# Patient Record
Sex: Female | Born: 1980 | Hispanic: No | Marital: Single | Smoking: Never smoker
Health system: Southern US, Community
[De-identification: ages and names within clinical notes are randomized; demographics above are authoritative.]

---

## 2021-06-13 ENCOUNTER — Encounter (HOSPITAL_COMMUNITY): Payer: Self-pay | Admitting: Emergency Medicine

## 2021-06-13 ENCOUNTER — Emergency Department (HOSPITAL_COMMUNITY)
Admission: EM | Admit: 2021-06-13 | Discharge: 2021-06-13 | Disposition: A | Discharge status: Home / Self Care | Payer: 59 | Attending: Emergency Medicine | Admitting: Emergency Medicine

## 2021-06-13 ENCOUNTER — Other Ambulatory Visit: Payer: Self-pay

## 2021-06-13 DIAGNOSIS — Y92410 Unspecified street and highway as the place of occurrence of the external cause: Secondary | ICD-10-CM | POA: Diagnosis not present

## 2021-06-13 DIAGNOSIS — M542 Cervicalgia: Secondary | ICD-10-CM | POA: Insufficient documentation

## 2021-06-13 DIAGNOSIS — R109 Unspecified abdominal pain: Secondary | ICD-10-CM | POA: Diagnosis not present

## 2021-06-13 DIAGNOSIS — M545 Low back pain, unspecified: Secondary | ICD-10-CM | POA: Insufficient documentation

## 2021-06-13 MED ORDER — METHOCARBAMOL 500 MG PO TABS: 500.0000 mg | ORAL_TABLET | Freq: Two times a day (BID) | ORAL | 0 refills | Status: AC

## 2021-06-13 NOTE — Discharge Instructions (Addendum)
The pain you are experiencing is likely due to muscle strain, you may take Ibuprofen or Naprosyn and Robaxin as needed for pain management. Do not combine with any pain reliever other than tylenol.  You may also use ice and heat, and over-the-counter remedies such as Biofreeze gel or salon pas lidocaine patches. The muscle soreness should improve over the next week. Follow up with your family doctor in the next week for a recheck if you are still having symptoms. Return to ED if pain is worsening, you develop weakness or numbness of extremities, or new or concerning symptoms develop. ° °
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
## 2021-06-20 ENCOUNTER — Encounter (HOSPITAL_COMMUNITY): Payer: Self-pay | Admitting: *Deleted

## 2021-06-20 ENCOUNTER — Emergency Department (HOSPITAL_COMMUNITY)
Admission: EM | Admit: 2021-06-20 | Discharge: 2021-06-20 | Disposition: A | Discharge status: Home / Self Care | Payer: 59 | Attending: Emergency Medicine | Admitting: Emergency Medicine

## 2021-06-20 ENCOUNTER — Other Ambulatory Visit: Payer: Self-pay

## 2021-06-20 ENCOUNTER — Emergency Department (HOSPITAL_COMMUNITY): Payer: 59

## 2021-06-20 DIAGNOSIS — M545 Low back pain, unspecified: Secondary | ICD-10-CM | POA: Insufficient documentation

## 2021-06-20 DIAGNOSIS — Y92410 Unspecified street and highway as the place of occurrence of the external cause: Secondary | ICD-10-CM | POA: Insufficient documentation

## 2021-06-20 DIAGNOSIS — M25551 Pain in right hip: Secondary | ICD-10-CM | POA: Insufficient documentation

## 2021-06-20 MED ORDER — PREDNISONE 10 MG (21) PO TBPK: ORAL_TABLET | Freq: Every day | ORAL | 0 refills | Status: AC

## 2021-06-20 MED ORDER — KETOROLAC TROMETHAMINE 15 MG/ML IJ SOLN
15.0000 mg | Freq: Once | INTRAMUSCULAR | Status: AC
  Administered 2021-06-20: 15 mg via INTRAMUSCULAR
  Filled 2021-06-20: qty 1

## 2021-06-20 MED ORDER — OXYCODONE-ACETAMINOPHEN 5-325 MG PO TABS
2.0000 | ORAL_TABLET | Freq: Once | ORAL | Status: AC
Start: 2021-06-20 — End: 2021-06-20
  Administered 2021-06-20: 2 via ORAL
  Filled 2021-06-20: qty 2

## 2021-06-20 NOTE — Discharge Instructions (Addendum)
As we discussed, if your pain does not improve I recommend you seek orthopedic evaluation at your earliest convenience.
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
## 2021-06-20 NOTE — ED Notes (Signed)
Called to be placed in a room, no answer.
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
## 2022-02-14 ENCOUNTER — Ambulatory Visit (INDEPENDENT_AMBULATORY_CARE_PROVIDER_SITE_OTHER): Payer: Self-pay

## 2022-02-14 ENCOUNTER — Encounter (HOSPITAL_COMMUNITY): Payer: Self-pay

## 2022-02-14 ENCOUNTER — Ambulatory Visit (HOSPITAL_COMMUNITY)
Admission: EM | Admit: 2022-02-14 | Discharge: 2022-02-14 | Disposition: A | Discharge status: Home / Self Care | Payer: Self-pay | Attending: Emergency Medicine | Admitting: Emergency Medicine

## 2022-02-14 DIAGNOSIS — S93602A Unspecified sprain of left foot, initial encounter: Secondary | ICD-10-CM

## 2022-02-14 DIAGNOSIS — M79672 Pain in left foot: Secondary | ICD-10-CM

## 2022-02-14 MED ORDER — IBUPROFEN 800 MG PO TABS: 800.0000 mg | ORAL_TABLET | Freq: Three times a day (TID) | ORAL | 0 refills | Status: AC

## 2022-02-14 NOTE — ED Triage Notes (Signed)
2 day h/o dorsal surface left foot pain that has increased since the onset. ?Notes some swelling. ?Pain with ambulation. No falls or injuries ?

## 2022-02-14 NOTE — ED Provider Notes (Signed)
?[facility] ? ? ?MRN: [id number] DOB: 11/04/1980 ? ?Subjective:  ? ?Chief Complaint;  ?Chief Complaint  ?Patient presents with  ? Foot Pain  ?  L  ? ? ?Natasha Gregory is a 41 y.o. female presenting for left top of her foot pain for the last few days increasingly worse.  She denies any specific trauma.  She does actively walk her dog and climbs stairs regularly.  She denies taking any medication for this pain ? ?No current facility-administered medications for this encounter. ? ?Current Outpatient Medications:  ?  ibuprofen (ADVIL) 800 MG tablet, Take 1 tablet (800 mg total) by mouth 3 (three) times daily., Disp: 21 tablet, Rfl: 0 ?  methocarbamol (ROBAXIN) 500 MG tablet, Take 1 tablet (500 mg total) by mouth 2 (two) times daily., Disp: 20 tablet, Rfl: 0 ?  predniSONE (STERAPRED UNI-PAK 21 TAB) 10 MG (21) TBPK tablet, Take by mouth daily. Take 6 tabs by mouth daily  for 2 days, then 5 tabs for 2 days, then 4 tabs for 2 days, then 3 tabs for 2 days, 2 tabs for 2 days, then 1 tab by mouth daily for 2 days, Disp: 42 tablet, Rfl: 0  ? ?Allergies  ?Allergen Reactions  ? Morphine And Related   ? Penicillins   ?  amoxicillin ?  ? ? ?History reviewed. No pertinent past medical history.  ? ?Review of Systems  ?All other systems reviewed and are negative. ? ? ?Objective:  ? ?Vitals: ?BP (!) 143/75 (BP Location: Right Arm)   Pulse 85   Temp 98.7 ?F (37.1 ?C) (Oral)   Resp 18   SpO2 97%  ? ?Physical Exam ?Vitals and nursing note reviewed.  ?Constitutional:   ?   General: She is not in acute distress. ?   Appearance: She is well-developed.  ?HENT:  ?   Head: Normocephalic and atraumatic.  ?Eyes:  ?   Conjunctiva/sclera: Conjunctivae normal.  ?Cardiovascular:  ?   Rate and Rhythm: Normal rate and regular rhythm.  ?   Heart sounds: No murmur heard. ?Pulmonary:  ?   Effort: Pulmonary effort is normal. No respiratory distress.  ?   Breath sounds: Normal breath sounds.  ?Abdominal:  ?   Palpations: Abdomen  is soft.  ?   Tenderness: There is no abdominal tenderness.  ?Musculoskeletal:     ?   General: Swelling present.  ?   Cervical back: Neck supple.  ?   Comments: Dorsal aspect of L foot just distal to ankle positive tenderness to palpation and mild swelling.  No erythema or evidence of infection, no deformity no NVD D full range of motion  ?Skin: ?   General: Skin is warm and dry.  ?   Capillary Refill: Capillary refill takes less than 2 seconds.  ?Neurological:  ?   Mental Status: She is alert.  ?Psychiatric:     ?   Mood and Affect: Mood normal.  ? ? ?No results found for this or any previous visit (from the past 24 hour(s)). ? ?DG Foot Complete Left ? ?Result Date: 02/14/2022 ?CLINICAL DATA:  Swelling on dorsal aspect of foot. EXAM: LEFT FOOT - COMPLETE 3+ VIEW COMPARISON:  None. FINDINGS: There is no evidence of fracture or dislocation. Enthesopathic changes are noted at the insertion site of the Achilles tendon. There is no evidence of arthropathy or other focal bone abnormality. Soft tissues are unremarkable. IMPRESSION: No acute fracture or dislocation. Electronically Signed   By: Vernona Rieger  Ladona Ridgel M.D.   On: 02/14/2022 20:05    ?  ? ?Assessment and Plan :  ? ?1. Sprain of left foot, initial encounter   ? ? ?Meds ordered this encounter  ?Medications  ? ibuprofen (ADVIL) 800 MG tablet  ?  Sig: Take 1 tablet (800 mg total) by mouth 3 (three) times daily.  ?  Dispense:  21 tablet  ?  Refill:  0  ? ? ?MDM:  ?Natasha Gregory is a 41 y.o. female presenting for pain and swelling to the left dorsal aspect forefoot unknown etiology.  X-ray shows no evidence of fracture.  Patient is advised to use RICE therapy.  Motrin prescribed.  She will return if not improving over the next week sooner if worse.  I discussed treatment, follow up and return instructions. Questions were answered. Patient/representative stated understanding of instructions and patient is stable for discharge. ?Dewaine Conger FNP-C MCN  ?  ?Jone Baseman,  NP ?02/14/22 2017 ? ?

## 2022-02-14 NOTE — Discharge Instructions (Signed)
The x-ray showed no evidence of fracture.  Ice and elevation will help.  Use Ace wrap for compression.  Limit ambulation until swelling resolved.  Take Motrin as prescribed add Tylenol if needed ?
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
